# Patient Record
Sex: Female | Born: 1960 | Race: White | Hispanic: No
Health system: Midwestern US, Academic
[De-identification: ages and names within clinical notes are randomized; demographics above are authoritative.]

---

## 2017-01-25 MED ORDER — LIDOCAINE (PF) 200 MG/10 ML (2 %) IJ SYRG
0 refills | Status: DC
Start: 2017-01-25 — End: 2017-01-25

## 2017-01-25 MED ORDER — REMIFENTANYL 1000MCG IN NS 20ML (OR)
0 refills | Status: DC
Start: 2017-01-25 — End: 2017-01-25

## 2017-01-25 MED ORDER — KETAMINE 10 MG/ML IJ SOLN
0 refills | Status: DC
Start: 2017-01-25 — End: 2017-01-25

## 2017-01-25 MED ORDER — FENTANYL CITRATE (PF) 50 MCG/ML IJ SOLN
0 refills | Status: DC
Start: 2017-01-25 — End: 2017-01-25

## 2017-01-25 MED ORDER — ACETAMINOPHEN 1,000 MG/100 ML (10 MG/ML) IV SOLN
0 refills | Status: DC
Start: 2017-01-25 — End: 2017-01-25

## 2017-01-25 MED ORDER — DEXAMETHASONE SODIUM PHOSPHATE 4 MG/ML IJ SOLN
INTRAVENOUS | 0 refills | Status: DC
Start: 2017-01-25 — End: 2017-01-25

## 2017-01-25 MED ORDER — HALOPERIDOL LACTATE 5 MG/ML IJ SOLN
0 refills | Status: DC
Start: 2017-01-25 — End: 2017-01-25

## 2017-01-25 MED ORDER — PHENYLEPHRINE IN 0.9% NACL(PF) 1 MG/10 ML (100 MCG/ML) IV SYRG
INTRAVENOUS | 0 refills | Status: DC
Start: 2017-01-25 — End: 2017-01-25

## 2017-01-25 MED ORDER — PROPOFOL INJ 10 MG/ML IV VIAL
0 refills | Status: DC
Start: 2017-01-25 — End: 2017-01-25

## 2017-01-25 MED ORDER — SUCCINYLCHOLINE CHLORIDE 20 MG/ML IJ SOLN
INTRAVENOUS | 0 refills | Status: DC
Start: 2017-01-25 — End: 2017-01-25

## 2017-01-25 MED ORDER — ONDANSETRON HCL (PF) 4 MG/2 ML IJ SOLN
INTRAVENOUS | 0 refills | Status: DC
Start: 2017-01-25 — End: 2017-01-25

## 2017-01-25 MED ORDER — CEFAZOLIN 1 GRAM IJ SOLR
0 refills | Status: DC
Start: 2017-01-25 — End: 2017-01-25

## 2017-01-25 MED ORDER — PHENYLEPHRINE IV DRIP (STD CONC)
0 refills | Status: DC
Start: 2017-01-25 — End: 2017-01-25

## 2017-03-20 MED ORDER — METHYLPREDNISOLONE 4 MG PO DSPK
ORAL_TABLET | 0 refills | Status: AC
Start: 2017-03-20 — End: ?

## 2017-05-09 ENCOUNTER — Ambulatory Visit: Admit: 2017-05-09 | Discharge: 2017-05-10 | Payer: MEDICARE

## 2017-05-09 DIAGNOSIS — H903 Sensorineural hearing loss, bilateral: Principal | ICD-10-CM

## 2017-05-09 NOTE — Progress Notes
Loretta Maxwell was seen in the clinic today for continued monitoring of her Advanced Bionics cochlear implant.    Patient History:  Loretta Maxwell is sent by Loretta Maxwell for cochlear implant evaluation.  She has a history of Meniere's disease, what sounds like bilaterally and has undergone bilateral endolymphatic sac surgery.  Her left ear is her worse ear.     Surgical Note:  Full round window insertion of the electrode was performed.  This was also noted on the x-ray.     [name] returns for routine cochlear implant follow up. She reports she continues to improve with clarity but acknowledges she still misses parts of conversations.    3 month outcomes assessed first, minor mapping changes made after.       Internal Device  Ear Internal Device Initial Stimulation Date Time Post Initial Stimulation Surgeon   left Hi Res Ultra/ Slim J 02/06/17 3 months Dr. Paulene Floor       Equipment:   Ear Processor Serial Number Magnet Strength Processor Condition   right [id number] new   right [id number] New     Implant site: no concerns    Manufacturer: Phonak  Model: Naida Link UP  Serial Numbers: [id number]     Repair Warranty: 02/06/18  L&D Warranty: 02/06/18  Battery: 675  [name]: [id number]  Programs: 1- AutoSense  Volume: activated  Fit Date: 02/06/17  Purchase Type: included in CI kit    This hearing aid is not paired to CI processor.      Assessment of Auditory Rehabilitation Status  Start Time: 945a    End Time: 1020am    SF thresholds assessed, freshnoise, contra ear open:  250 Hz: 30 dB  500 Hz: 30 dB  1000 Hz: 35 dB  2000 Hz: 40 dB  3000 Hz: 40 dB  4000 Hz: 30 dB  6000 Hz: 25 dB      Test pre operative - binaural hearing aids 3 months -implant only Date Date   CNC Words 64% 40%     AZ Bio (in Quiet) 80% 32%  (81% + contra aid)     Right aid only 63%      Left aid only 29%      AZ Bio (+5 dB SNR) 29% 6%       Residual Hearing Check:  Left PTA 100 dB  (pre op: 85 dB) Right PTA 85 dB (pre op: 75 dB)  *recall this ear dropped, noted at our last visit.  Dr. Juel Burrow prescribed steroid burst.   Today's thresholds are stable with last visit.      Programming: (sans contralateral aid)  ??? Impedences WNL  ??? e16 deactivated: no loudness growth as charge unit increased / short circuit - new as of 03/20/17    Familiar Map 8 opened  ??? [name] reports she would like a little more volume  ??? e2-5 and 12-14 increased slightly to perceptually increase volume  ??? Clearvoice Medium activated today  ??? WindBlock activated today.    Programmed today: 1. Hi Res Optima S 9          2. 10 (Ultra Zoom)    Summary:   [name] was seen for continued monitoring of her cochlear implant.  Soundfield check confirms audibility and outcomes confirm functionality.   At 3 months post implantation, she is scoring the same bimodally as she did with binaural aids pre-operatively however her subjective improvement is large.  Clinical Recommendations:   ??? Next Visit: 6 month outcomes    Patient Recommendations:   ??? [name] should return for reprogramming 08/05/2017

## 2017-08-05 ENCOUNTER — Ambulatory Visit: Admit: 2017-08-05 | Discharge: 2017-08-05 | Payer: MEDICARE

## 2017-08-05 DIAGNOSIS — H903 Sensorineural hearing loss, bilateral: Principal | ICD-10-CM

## 2017-08-05 NOTE — Progress Notes
Loretta Maxwell was seen in the clinic today for continued monitoring of her Advanced Bionics cochlear implant.    Patient History:  [name] is sent by [name] for cochlear implant evaluation.  She has a history of Meniere's disease, what sounds like bilaterally and has undergone bilateral endolymphatic sac surgery.  Her left ear is her worse ear.     Surgical Note:  Full round window insertion of the electrode was performed.  This was also noted on the x-ray.     [name] returns for routine cochlear implant follow up. Upon arrival, she appears dismal about the lack of progress made with her implant.    6 month outcomes assessed first, minor mapping changes made after.       Internal Device  Ear Internal Device Initial Stimulation Date Time Post Initial Stimulation Surgeon   left Hi Res Ultra/ Slim J 02/06/17 6 months Dr. Paulene Floor / [facility]       Equipment:   Ear Processor Serial Number Magnet Strength Processor Condition   right [id number] new   right [id number] New     Implant site: no concerns    Manufacturer: Phonak  Model: Naida Link UP  Serial Numbers: [id number]     Repair Warranty: 02/06/18  L&D Warranty: 02/06/18  Battery: 675  [name]: [id number]  Programs: 1- AutoSense  Volume: activated  Fit Date: 02/06/17  Purchase Type: included in CI kit    This hearing aid is not paired to CI processor.      Assessment of Auditory Rehabilitation Status  Start Time: 1p    End Time: 140p    SF thresholds assessed today and results found to be within the expected range (see Procedures tab for scan).       Test pre operative - binaural hearing aids 3 months -implant only 6 months - implant only, contra ear open Date   CNC Words 64% 40% 44%    AZ Bio (in Quiet) 80% 32%  (81% + contra aid) 48%    Right aid only 63%      Left aid only 29%      AZ Bio (+5 dB SNR) 29% 6% 18%      Residual Hearing Check assessed today (see Procedures tab for scan).  Left PTA 107 dB  (pre op: 85 dB) Right PTA 83 dB (pre op: 75 dB)  *recall this ear dropped, noted 02/2017.  Dr. Juel Burrow prescribed steroid burst - ear did not recover.      Programming: (sans contralateral aid)  ??? Impedences WNL  ??? e16 deactivated: no loudness growth as charge unit increased / short circuit - new as of 03/20/17    Familiar Map 9 opened  ??? Global MCLs increased to live voice  ??? Her Tmic was also change today as it has been 6 months and she is a smoker.    Programmed today: 1. Hi Res Optima S 11          2. 12 (Ultra Zoom)    Summary:   [name] was seen for continued monitoring of her cochlear implant.  Soundfield check confirms audibility and outcomes confirm functionality with improvement noted since last check three months ago.  Subjectively, the benefit since implantation is high.      Clinical Recommendations:   ??? Next Visit: 12 month outcomes  ??? Next Visit: change Tmic (in her backup kit)    Patient Recommendations:   ??? [name] should return  for reprogramming 01/20/2018.

## 2018-01-08 ENCOUNTER — Encounter: Admit: 2018-01-08 | Discharge: 2018-01-08 | Payer: MEDICAID

## 2018-01-23 ENCOUNTER — Encounter: Admit: 2018-01-23 | Discharge: 2018-01-23 | Payer: MEDICAID

## 2018-01-23 DIAGNOSIS — M1990 Unspecified osteoarthritis, unspecified site: ICD-10-CM

## 2018-01-23 DIAGNOSIS — E119 Type 2 diabetes mellitus without complications: Principal | ICD-10-CM

## 2018-01-23 DIAGNOSIS — G510 Bell's palsy: ICD-10-CM

## 2018-01-23 DIAGNOSIS — G4733 Obstructive sleep apnea (adult) (pediatric): ICD-10-CM

## 2018-01-23 DIAGNOSIS — K589 Irritable bowel syndrome without diarrhea: ICD-10-CM

## 2018-01-23 DIAGNOSIS — I82409 Acute embolism and thrombosis of unspecified deep veins of unspecified lower extremity: ICD-10-CM

## 2018-01-23 DIAGNOSIS — K219 Gastro-esophageal reflux disease without esophagitis: ICD-10-CM

## 2018-01-23 DIAGNOSIS — H8109 Meniere's disease, unspecified ear: ICD-10-CM

## 2018-01-23 DIAGNOSIS — R000 Tachycardia, unspecified: ICD-10-CM

## 2018-01-23 DIAGNOSIS — K760 Fatty (change of) liver, not elsewhere classified: ICD-10-CM

## 2018-01-24 ENCOUNTER — Ambulatory Visit: Admit: 2018-01-24 | Discharge: 2018-01-24 | Payer: Medicare Other

## 2018-01-24 DIAGNOSIS — H903 Sensorineural hearing loss, bilateral: Principal | ICD-10-CM

## 2019-09-30 ENCOUNTER — Encounter: Admit: 2019-09-30 | Discharge: 2019-09-30 | Payer: MEDICAID

## 2019-09-30 ENCOUNTER — Ambulatory Visit: Admit: 2019-09-30 | Discharge: 2019-09-30 | Payer: MEDICAID

## 2019-09-30 DIAGNOSIS — H903 Sensorineural hearing loss, bilateral: Secondary | ICD-10-CM

## 2019-09-30 NOTE — Progress Notes
Loretta Maxwell was seen in the clinic today for continued monitoring of her Advanced Bionics cochlear implant.    Patient History:  Loretta Maxwell is sent by Loretta Maxwell for cochlear implant evaluation.  She has a history of Meniere's disease, what sounds like bilaterally and has undergone bilateral endolymphatic sac surgery.  Her left ear is her worse ear.     Surgical Note:  Surgery Date: 01/25/2017  Full round window insertion of the electrode was performed.  This was also noted on the x-ray.     Loretta Maxwell returns for routine cochlear implant follow up.  She is having no concerns, outside of a random intermittency in sound quality (could be parts?)        Internal Device  Ear Internal Device Initial Stimulation Date Time Post Initial Stimulation Surgeon   left Hi Res Ultra/ Slim J 02/06/17 2 years, 8 months Dr. Paulene Floor / [facility]       Equipment:   Ear Processor Serial Number Magnet Strength Processor Condition   right [id number] good   right [id number] [id number] Used; has broken cable, very used headpiece and a broken Tmic     Implant site: no concerns  Incision: no concerns    Manufacturer: Phonak  Model: Naida Link UP  Serial Numbers: [id number]     Repair Warranty: 02/06/18  L&D Warranty: 02/06/18  Battery: 675  [name]: [id number]  Programs: 1- AutoSense  Volume: activated  Fit Date: 02/06/17  Purchase Type: included in CI kit    This hearing aid is not paired to CI processor.      Assessment of Auditory Rehabilitation Status  not assessed today    SF thresholds assessed today and results found to be within the expected range ~25 / 30 dB 250 - 6000 Hz       Test pre operative - binaural hearing aids 3 months -implant only 6 months - implant only, contra ear open 12 months - implant only, contra ear open    CNC Words 64% 40% 44% 64%    AZ Bio (in Quiet) 80%  81%      Right aid only 63%       Left aid only 29% 32% 48% 74%    AZ Bio (+5 dB SNR) 29% 6% 18% 16%      Residual Hearing Check assessed today Left PTA 83 dB  (pre op: 85 dB)  Right PTA 87 dB (pre op: 75 dB)  *recall this ear dropped, noted 02/2017.  Dr. Juel Burrow prescribed steroid burst - ear did not recover.      Programming:  ? Impedences were assessed and found to be stable  ? e16 deactivated: no loudness growth as charge unit increased / short circuit - new as of 03/20/17  ? e15 deactivated today: impedance higher than neighboring electrodes    Familiar Map 11 opened and settings confirmed  e15 deactivated today  Soft Voice actived.    Setting saved as 14.    Programmed today: Hi Res Optima S 14    Summary:   [name] was seen for continued monitoring of her cochlear implant.   Stable subjective report.    Clinical Recommendations:   ? Next Visit: outcomes    Patient Recommendations:   ? [name] should return for reprogramming 12-18 months  ? Consider Right implant (Medicare is primary insurance)  ? Contact AB to replace broken parts.

## 2022-01-17 ENCOUNTER — Encounter: Admit: 2022-01-17 | Discharge: 2022-01-17 | Payer: MEDICAID

## 2022-02-05 ENCOUNTER — Encounter: Admit: 2022-02-05 | Discharge: 2022-02-05 | Payer: Medicare Other

## 2022-02-05 ENCOUNTER — Ambulatory Visit: Admit: 2022-02-05 | Discharge: 2022-02-05 | Payer: Medicare Other

## 2022-02-28 ENCOUNTER — Ambulatory Visit: Admit: 2022-02-28 | Discharge: 2022-02-28 | Payer: Medicare Other

## 2022-02-28 ENCOUNTER — Encounter: Admit: 2022-02-28 | Discharge: 2022-02-28 | Payer: Medicare Other

## 2022-02-28 DIAGNOSIS — Z9621 Cochlear implant status: Secondary | ICD-10-CM

## 2022-02-28 DIAGNOSIS — H903 Sensorineural hearing loss, bilateral: Secondary | ICD-10-CM

## 2022-05-24 ENCOUNTER — Encounter: Admit: 2022-05-24 | Discharge: 2022-05-24 | Payer: Medicare Other

## 2022-06-07 ENCOUNTER — Encounter: Admit: 2022-06-07 | Discharge: 2022-06-07 | Payer: Medicare Other

## 2022-06-12 ENCOUNTER — Encounter: Admit: 2022-06-12 | Discharge: 2022-06-12 | Payer: Medicare Other

## 2022-06-13 ENCOUNTER — Encounter: Admit: 2022-06-13 | Discharge: 2022-06-13 | Payer: Private Health Insurance - Indemnity

## 2022-06-14 ENCOUNTER — Ambulatory Visit: Admit: 2022-06-14 | Discharge: 2022-06-15 | Payer: Private Health Insurance - Indemnity

## 2022-06-14 ENCOUNTER — Encounter: Admit: 2022-06-14 | Discharge: 2022-06-14 | Payer: Private Health Insurance - Indemnity

## 2022-06-14 DIAGNOSIS — G4733 Obstructive sleep apnea (adult) (pediatric): Secondary | ICD-10-CM

## 2022-06-14 DIAGNOSIS — G510 Bell's palsy: Secondary | ICD-10-CM

## 2022-06-14 DIAGNOSIS — K219 Gastro-esophageal reflux disease without esophagitis: Secondary | ICD-10-CM

## 2022-06-14 DIAGNOSIS — E119 Type 2 diabetes mellitus without complications: Secondary | ICD-10-CM

## 2022-06-14 DIAGNOSIS — M1990 Unspecified osteoarthritis, unspecified site: Secondary | ICD-10-CM

## 2022-06-14 DIAGNOSIS — Z01818 Encounter for other preprocedural examination: Secondary | ICD-10-CM

## 2022-06-14 DIAGNOSIS — K760 Fatty (change of) liver, not elsewhere classified: Secondary | ICD-10-CM

## 2022-06-14 DIAGNOSIS — I82409 Acute embolism and thrombosis of unspecified deep veins of unspecified lower extremity: Secondary | ICD-10-CM

## 2022-06-14 DIAGNOSIS — K589 Irritable bowel syndrome without diarrhea: Secondary | ICD-10-CM

## 2022-06-14 DIAGNOSIS — R000 Tachycardia, unspecified: Secondary | ICD-10-CM

## 2022-06-14 DIAGNOSIS — H8109 Meniere's disease, unspecified ear: Secondary | ICD-10-CM

## 2022-06-14 NOTE — Pre-Anesthesia Patient Instructions
PREPROCEDURE INFORMATION    Arrival at the hospital  [facility]  [address]  [place], [place] [address]    Park in the P5 parking garage located at [address], [place], [place] [address].   If parking in the P5 garage, take the east elevators in the parking garage to the second level and walk to the entrance of the [employer].    Enter through the 1st floor main entrance and check in with Information Desk.   If you are a woman between the ages of 67 and 56, and have not had a hysterectomy, you will be asked for a urine sample prior to surgery.  Please do not urinate before arriving in the Surgery Waiting Room.  Once there, check in and let the attendant know if you need to provide a sample.    You will receive a call with your surgery arrival time between 2:30pm and 4:30pm the last business day before your procedure.  If you do not receive a call, please call [phone] before 4:30pm or [phone] after 4:30pm.    Eating or drinking before surgery  Nothing to eat after 11:00pm the night before your surgery including gum, mint, candy. You may have clear liquids up to 2 hours before your surgery time. If you have received specific instructions from your surgeon, please follow those.     Clear Liquid Examples:   Clear juice (Apple or cranberry), no pulp   Coffee and tea with or without sugar (no cream)   Sports drinks - Powerade/Gatorade   Soda   Bowel Prep solutions        Planning transportation for outpatient procedure  For your safety, you will need to arrange for a responsible ride/person to accompany you home due to sedation or anesthesia with your procedure.  An [employer], taxi or other public transportation driver is not considered a responsible person to accompany you home.    Bath/Shower Instructions  Take a bath or shower with antibacterial soap the night before and the morning of your procedure. Use clean towels.  Put on clean clothes after bath or shower.  Avoid using lotion and oils.  Sleep on clean sheets if bath or shower is done the night before procedure.    Morning of your procedure:  Brush your teeth and tongue  Do not smoke, vape, chew or user any tobacco products.  Do not shave the area where you will have surgery.  Remove nail polish, makeup and all jewelry (including piercings) before coming to the hospital.  Dress in clean, loose, comfortable clothing.    Valuables  Leave money, credit cards, jewelry, and any other valuables at home. The [facility] is not responsible for the loss or breakage of personal items.    What to bring to the hospital  ID/Insurance card  Medical Device card  Official documents for legal guardianship  Copy of your Living Will, Advanced Directives, and/or Durable Power of Attorney. If you have these documents, please bring them to the admissions office on the day of your surgery to be scanned into your records.  Do not bring medications from home unless instructed by a pharmacist.  CPAP/BiPAP machine (including all supplies)  Walker, cane, or motorized scooter  Cases for glasses/hearing aids/contact lens (bring solutions for contacts)  Stimulator remote  Insulin pump and supplies as directed by pharmacy     Notify us at [employer]: [phone] on the day of your procedure if:  You need to cancel your procedure.  You are going to be late.    Notify your surgeon if:  There is a possibility that you are pregnant.  You become ill with a cough, fever, sore throat, nausea, vomiting or flu-like symptoms.  You have any open wounds/sores that are red, painful, draining, or are new since you last saw the doctor.  You need to cancel your procedure.    Preparing to get your medications at discharge  Your surgeon may prescribe you medications to take after your procedure.  If you like the convenience of having your medications filled here at [facility], please do the following:  Go to [place] pharmacy after your [facility] appointment to put a credit card on file.  Call [place] pharmacy at [phone] (Monday-Friday 7am-9pm or Saturday and Sunday 9am-5pm) to put a credit card on file.  Bring a credit card or cash on the day of your procedure- please leave with a family member rather than bringing it into the preop area.    Current Visitor Policy:  Visitors must be free of fever and symptoms to be in our facilities.  No more than 2 visitors per patient are allowed.  Additional guidelines may vary, based on patient care area or patient's condition.  Patients in semiprivate rooms may have visitors, but visits should be coordinated so only two total visitors are in a room at a time due to space limitations.  Children younger than age 62 are allowed to visit inpatients.    Thank you for participating in your Preoperative Assessment Clinic visit today.    If you have any changes to your health or hospitalizations between now and your surgery, please call us at [phone] for Main instructions.    Instructions given to patient via: MyChart and verbal

## 2022-06-19 ENCOUNTER — Ambulatory Visit: Admit: 2022-06-19 | Discharge: 2022-06-20 | Payer: Private Health Insurance - Indemnity

## 2022-06-29 ENCOUNTER — Ambulatory Visit: Admit: 2022-06-29 | Discharge: 2022-06-29 | Payer: Private Health Insurance - Indemnity

## 2022-06-29 ENCOUNTER — Encounter: Admit: 2022-06-29 | Discharge: 2022-06-29 | Payer: Private Health Insurance - Indemnity

## 2022-06-29 DIAGNOSIS — R000 Tachycardia, unspecified: Secondary | ICD-10-CM

## 2022-06-29 DIAGNOSIS — H8109 Meniere's disease, unspecified ear: Secondary | ICD-10-CM

## 2022-06-29 DIAGNOSIS — G4733 Obstructive sleep apnea (adult) (pediatric): Secondary | ICD-10-CM

## 2022-06-29 DIAGNOSIS — K589 Irritable bowel syndrome without diarrhea: Secondary | ICD-10-CM

## 2022-06-29 DIAGNOSIS — M1990 Unspecified osteoarthritis, unspecified site: Secondary | ICD-10-CM

## 2022-06-29 DIAGNOSIS — E119 Type 2 diabetes mellitus without complications: Secondary | ICD-10-CM

## 2022-06-29 DIAGNOSIS — I82409 Acute embolism and thrombosis of unspecified deep veins of unspecified lower extremity: Secondary | ICD-10-CM

## 2022-06-29 DIAGNOSIS — G510 Bell's palsy: Secondary | ICD-10-CM

## 2022-06-29 DIAGNOSIS — K760 Fatty (change of) liver, not elsewhere classified: Secondary | ICD-10-CM

## 2022-06-29 DIAGNOSIS — K219 Gastro-esophageal reflux disease without esophagitis: Secondary | ICD-10-CM

## 2022-06-29 MED ORDER — CEFAZOLIN 1 GRAM IJ SOLR
INTRAVENOUS | 0 refills | Status: DC
Start: 2022-06-29 — End: 2022-06-29
  Administered 2022-06-29: 16:00:00 2 g via INTRAVENOUS

## 2022-06-29 MED ORDER — FENTANYL CITRATE (PF) 50 MCG/ML IJ SOLN
INTRAVENOUS | 0 refills | Status: DC
Start: 2022-06-29 — End: 2022-06-29
  Administered 2022-06-29: 16:00:00 75 ug via INTRAVENOUS

## 2022-06-29 MED ORDER — PROPOFOL 10 MG/ML IV EMUL 100 ML (INFUSION)(AM)(OR)
INTRAVENOUS | 0 refills | Status: DC
Start: 2022-06-29 — End: 2022-06-29
  Administered 2022-06-29: 16:00:00 120 ug/kg/min via INTRAVENOUS

## 2022-06-29 MED ORDER — DEXAMETHASONE SODIUM PHOSPHATE 4 MG/ML IJ SOLN
INTRAVENOUS | 0 refills | Status: DC
Start: 2022-06-29 — End: 2022-06-29
  Administered 2022-06-29: 16:00:00 4 mg via INTRAVENOUS

## 2022-06-29 MED ORDER — PHENYLEPHRINE HCL IN 0.9% NACL 1 MG/10 ML (100 MCG/ML) IV SYRG
INTRAVENOUS | 0 refills | Status: DC
Start: 2022-06-29 — End: 2022-06-29
  Administered 2022-06-29 (×6): 100 ug via INTRAVENOUS

## 2022-06-29 MED ORDER — EPHEDRINE SULFATE 50 MG/ML IV SOLN
INTRAVENOUS | 0 refills | Status: DC
Start: 2022-06-29 — End: 2022-06-29
  Administered 2022-06-29: 17:00:00 10 mg via INTRAVENOUS

## 2022-06-29 MED ORDER — ARTIFICIAL TEARS (PF) SINGLE DOSE DROPS GROUP
OPHTHALMIC | 0 refills | Status: DC
Start: 2022-06-29 — End: 2022-06-29
  Administered 2022-06-29: 16:00:00 2 [drp] via OPHTHALMIC

## 2022-06-29 MED ORDER — SUCCINYLCHOLINE CHLORIDE 20 MG/ML IJ SOLN
INTRAVENOUS | 0 refills | Status: DC
Start: 2022-06-29 — End: 2022-06-29
  Administered 2022-06-29: 16:00:00 100 mg via INTRAVENOUS

## 2022-06-29 MED ORDER — PROPOFOL INJ 10 MG/ML IV VIAL
INTRAVENOUS | 0 refills | Status: DC
Start: 2022-06-29 — End: 2022-06-29
  Administered 2022-06-29: 16:00:00 150 mg via INTRAVENOUS

## 2022-06-29 MED ORDER — LIDOCAINE (PF) 200 MG/10 ML (2 %) IJ SYRG
INTRAVENOUS | 0 refills | Status: DC
Start: 2022-06-29 — End: 2022-06-29
  Administered 2022-06-29: 16:00:00 80 mg via INTRAVENOUS

## 2022-06-29 MED ORDER — REMIFENTANYL 1000MCG IN NS 20ML (OR)
INTRAVENOUS | 0 refills | Status: DC
Start: 2022-06-29 — End: 2022-06-29
  Administered 2022-06-29 (×2): .08 ug/kg/min via INTRAVENOUS

## 2022-06-29 MED ORDER — ONDANSETRON HCL (PF) 4 MG/2 ML IJ SOLN
INTRAVENOUS | 0 refills | Status: DC
Start: 2022-06-29 — End: 2022-06-29
  Administered 2022-06-29: 17:00:00 4 mg via INTRAVENOUS

## 2022-06-29 MED ADMIN — OXYCODONE 5 MG PO TAB [10814]: 5 mg | ORAL | @ 18:00:00 | Stop: 2022-06-29 | NDC 00406055223

## 2022-06-29 MED ADMIN — BACLOFEN 20 MG PO TAB [861]: 20 mg | ORAL | @ 19:00:00 | Stop: 2022-06-29 | NDC 00172409780

## 2022-06-29 MED ADMIN — EPINEPHRINE 1 MG/ML IJ SOLN [2850]: 2 mg | @ 17:00:00 | Stop: 2022-06-29 | NDC 42023016801

## 2022-06-29 MED ADMIN — CEFAZOLIN 1 GRAM IJ SOLR [1445]: 3000 mL | @ 17:00:00 | Stop: 2022-06-29 | NDC 00143992490

## 2022-06-29 MED ADMIN — LACTATED RINGERS IV SOLP [4318]: 1000 mL | INTRAVENOUS | @ 14:00:00 | Stop: 2022-06-29 | NDC 00338011704

## 2022-06-29 MED ADMIN — SODIUM CHLORIDE 0.9% IRRIGATION BAG [210330]: 3000 mL | @ 17:00:00 | Stop: 2022-06-29 | NDC 00338004747

## 2022-06-29 MED ADMIN — FENTANYL CITRATE (PF) 50 MCG/ML IJ SOLN [3037]: 50 ug | INTRAVENOUS | @ 18:00:00 | Stop: 2022-06-29 | NDC 00409909412

## 2022-06-29 MED ADMIN — SODIUM CHLORIDE 0.9 % IR SOLN [11403]: 1000 mL | @ 17:00:00 | Stop: 2022-06-29 | NDC 00338004804

## 2022-06-29 MED ADMIN — LIDOCAINE-EPINEPHRINE 1 %-1:100,000 IJ SOLN [15955]: 10 mL | INTRAMUSCULAR | @ 17:00:00 | Stop: 2022-06-29 | NDC 00409317817

## 2022-06-29 MED ADMIN — CEFAZOLIN 1 GRAM IJ SOLR [1445]: 1000 mL | @ 17:00:00 | Stop: 2022-06-29 | NDC 00143992490

## 2022-06-29 MED ADMIN — CELECOXIB 200 MG PO CAP [76958]: 200 mg | ORAL | @ 19:00:00 | Stop: 2022-06-29 | NDC 00904650361

## 2022-06-29 MED ADMIN — HALOPERIDOL LACTATE 5 MG/ML IJ SOLN [3584]: 1 mg | INTRAVENOUS | @ 18:00:00 | Stop: 2022-06-29 | NDC 63323047400

## 2022-06-29 MED FILL — CEPHALEXIN 500 MG PO CAP: 500 mg | ORAL | 5 days supply | Qty: 15 | Fill #1 | Status: CP

## 2022-06-29 MED FILL — METHYLPREDNISOLONE 4 MG PO DSPK: 4 mg | 6 days supply | Qty: 21 | Fill #1 | Status: CP

## 2022-06-29 MED FILL — TRAMADOL 50 MG PO TAB: 50 mg | ORAL | 3 days supply | Qty: 8 | Fill #1 | Status: CP

## 2022-07-02 ENCOUNTER — Encounter: Admit: 2022-07-02 | Discharge: 2022-07-02 | Payer: Private Health Insurance - Indemnity

## 2022-07-02 DIAGNOSIS — K219 Gastro-esophageal reflux disease without esophagitis: Secondary | ICD-10-CM

## 2022-07-02 DIAGNOSIS — G510 Bell's palsy: Secondary | ICD-10-CM

## 2022-07-02 DIAGNOSIS — M1990 Unspecified osteoarthritis, unspecified site: Secondary | ICD-10-CM

## 2022-07-02 DIAGNOSIS — H8109 Meniere's disease, unspecified ear: Secondary | ICD-10-CM

## 2022-07-02 DIAGNOSIS — G4733 Obstructive sleep apnea (adult) (pediatric): Secondary | ICD-10-CM

## 2022-07-02 DIAGNOSIS — K760 Fatty (change of) liver, not elsewhere classified: Secondary | ICD-10-CM

## 2022-07-02 DIAGNOSIS — I82409 Acute embolism and thrombosis of unspecified deep veins of unspecified lower extremity: Secondary | ICD-10-CM

## 2022-07-02 DIAGNOSIS — K589 Irritable bowel syndrome without diarrhea: Secondary | ICD-10-CM

## 2022-07-02 DIAGNOSIS — E119 Type 2 diabetes mellitus without complications: Secondary | ICD-10-CM

## 2022-07-02 DIAGNOSIS — R000 Tachycardia, unspecified: Secondary | ICD-10-CM

## 2022-07-09 ENCOUNTER — Ambulatory Visit: Admit: 2022-07-09 | Discharge: 2022-07-09 | Payer: Private Health Insurance - Indemnity

## 2022-07-09 ENCOUNTER — Encounter: Admit: 2022-07-09 | Discharge: 2022-07-09 | Payer: Private Health Insurance - Indemnity

## 2022-07-09 DIAGNOSIS — H903 Sensorineural hearing loss, bilateral: Secondary | ICD-10-CM

## 2022-07-09 DIAGNOSIS — K589 Irritable bowel syndrome without diarrhea: Secondary | ICD-10-CM

## 2022-07-09 DIAGNOSIS — G510 Bell's palsy: Secondary | ICD-10-CM

## 2022-07-09 DIAGNOSIS — K760 Fatty (change of) liver, not elsewhere classified: Secondary | ICD-10-CM

## 2022-07-09 DIAGNOSIS — H8109 Meniere's disease, unspecified ear: Secondary | ICD-10-CM

## 2022-07-09 DIAGNOSIS — G4733 Obstructive sleep apnea (adult) (pediatric): Secondary | ICD-10-CM

## 2022-07-09 DIAGNOSIS — R000 Tachycardia, unspecified: Secondary | ICD-10-CM

## 2022-07-09 DIAGNOSIS — K219 Gastro-esophageal reflux disease without esophagitis: Secondary | ICD-10-CM

## 2022-07-09 DIAGNOSIS — I82409 Acute embolism and thrombosis of unspecified deep veins of unspecified lower extremity: Secondary | ICD-10-CM

## 2022-07-09 DIAGNOSIS — M1990 Unspecified osteoarthritis, unspecified site: Secondary | ICD-10-CM

## 2022-07-09 DIAGNOSIS — E119 Type 2 diabetes mellitus without complications: Secondary | ICD-10-CM

## 2022-07-09 MED ORDER — CLONAZEPAM 0.25 MG PO TBDI
.25 mg | ORAL_TABLET | Freq: Three times a day (TID) | ORAL | 0 refills | Status: AC | PRN
Start: 2022-07-09 — End: ?

## 2022-07-09 NOTE — Progress Notes
Loretta Maxwell was seen in the clinic today for initial activation of her Advanced Bionics cochlear implant.    Patient History:  Loretta Maxwell is sent by [name] for cochlear implant evaluation.  She has a history of Meniere's disease, what sounds like bilaterally and has undergone bilateral endolymphatic sac surgery.  Her left ear is her worse ear.     Surgical Note:  1st implant surgery Date: 01/25/2017  Explanted 06/29/22 (V1 failure)  2nd implant surgery date: 06/29/2022  I inserted the device off its stylette due to its round window I then got an x-ray and it appeared the compartments location in the cochlea.        Internal Device  Ear Internal Device Initial Stimulation Date Time Post Initial Stimulation Surgeon / location   left Hi Res Ultra 3D Hi Focus midScala V2 06/29/2022 -- Dr. Paulene Floor, MD / [place]   left Hi Res Ultra / HiFocus MidScala Explanted 06/29/2022 5 years, 1 month Dr. Paulene Floor / [place]       Equipment:   Ear Processor Magnet Strength Processor Condition   left [id number] used   left [phone]  Used - needs cable and battery   right Naida P90-UP  Fit from outside clinic     Implant site: no concerns  Incision: no concerns      Assessment of Aural Rehabilitation Status: not assessed today    Pre op:  Test pre operative - binaural hearing aids   CNC Words - bilateral 64%   CNC Words - left    AZ Bio (in Quiet) - bilateral 80%   ? Right aid only 63%   ? Left aid only 29%   AZ Bio (+5 dB SNR) - bilateral 29%   AZ Bio (+5 dB SNR) - left          Programming:  ? Impedences were assessed today; all were found to be WNL.    M levels set via speech bursts using the loudness growth chart.  High frequency loudness growth verified via tone bursts.  In live mode, global MCLs increased to her satisfaction.  E1-3 rolled off to improve quality.    Setting saved as 1.    Programmed today:  Hi Res Optima S 1    Summary:   Loretta Maxwell was seen for initial activation of her revised cochlear implant.  Upon activation, she reported a good strong sound quality.     Clinic recommendations:  Next visit: SF thresholds      Recommendations:   ? Loretta Maxwell will return as scheduled for routine mapping, in 1.5 weeks.  ? Loretta Maxwell has new insurance therefore will attempt to obtain 1 new cable and 1 new battery so her backup processor is functional.  ? Of note, with her previous insurance she was approved for a processor upgrade however she was unable to pay the co-pay.

## 2022-07-25 ENCOUNTER — Encounter: Admit: 2022-07-25 | Discharge: 2022-07-25 | Payer: Private Health Insurance - Indemnity

## 2022-08-02 ENCOUNTER — Encounter: Admit: 2022-08-02 | Discharge: 2022-08-02 | Payer: Medicare Other

## 2022-08-02 ENCOUNTER — Ambulatory Visit: Admit: 2022-08-02 | Discharge: 2022-08-02 | Payer: Medicare Other

## 2022-08-02 DIAGNOSIS — H903 Sensorineural hearing loss, bilateral: Secondary | ICD-10-CM

## 2022-08-02 NOTE — Progress Notes
Loretta Maxwell was seen in the clinic today for continued monitoring of her Advanced Bionics cochlear implant.    Patient History:  Loretta Maxwell is sent by Loretta Maxwell for cochlear implant evaluation.  She has a history of Meniere's disease, what sounds like bilaterally and has undergone bilateral endolymphatic sac surgery.  Her left ear is her worse ear.     Surgical Note:  1st implant surgery Date: 01/25/2017  Explanted 06/29/22 (V1 failure)  2nd implant surgery date: 06/29/2022  I inserted the device off its stylette due to its round window I then got an x-ray and it appeared the compartments location in the cochlea.        Shell returns today for routine cochlear implant follow up.  She reports she feels she has been hearing well since our last session, 1 month ago.  She does mention tightness at her magnet site.      Internal Device  Ear Internal Device Initial Stimulation Date Time Post Initial Stimulation Surgeon / location   left Hi Res Ultra 3D Hi Focus midScala V2 06/29/2022 1 month Dr. Paulene Floor, MD / [place]   left Hi Res Ultra / HiFocus MidScala Explanted 06/29/2022 5 years, 1 month Dr. Paulene Floor / [place]       Equipment:   Ear Processor Magnet Strength Processor Condition   left [phone] used   left [phone]  Used - needs cable and battery   right Naida P90-UP  Fit from outside clinic     Implant site: no concerns, but magnet was reduced to a 1 today.  Incision: no concerns      Assessment of Aural Rehabilitation Status: Start Time: 11am   End time: 1140a    SF thresholds assessed today and were found to be within the expected range:        Pre op:  Test pre operative - binaural hearing aids   CNC Words - bilateral 64%   CNC Words - left    AZ Bio (in Quiet) - bilateral 80%   ? Right aid only 63%   ? Left aid only 29%   AZ Bio (+5 dB SNR) - bilateral 29%   AZ Bio (+5 dB SNR) - left      Left:   test 1 month   CNC Words 46%   Az Bio Q  42%       Programming:  ? Impedences were assessed today; all were found to be WNL.    No mapping parameters changed today.    Current program:  Hi Res Optima S 1    Summary:   Loretta Maxwell was seen for continued monitoring of her revised cochlear implant.  Outcomes at 1 month are encouraging!  Audibility confirmed.    Clinic recommendations:  Next Visit: outcomes      Recommendations:   ? Loretta Maxwell will return as scheduled for routine mapping, in 1 year, or sooner if she obtains a new processor.

## 2022-08-07 ENCOUNTER — Encounter: Admit: 2022-08-07 | Discharge: 2022-08-07 | Payer: Medicare Other

## 2022-08-07 MED ORDER — CLONAZEPAM 0.5 MG PO TBDI
ORAL_TABLET | 0 refills
Start: 2022-08-07 — End: ?

## 2022-08-27 ENCOUNTER — Encounter: Admit: 2022-08-27 | Discharge: 2022-08-27 | Payer: Medicare Other

## 2022-08-27 MED ORDER — CLONAZEPAM 0.25 MG PO TBDI
.25 mg | ORAL_TABLET | Freq: Three times a day (TID) | ORAL | 0 refills | Status: AC | PRN
Start: 2022-08-27 — End: ?

## 2022-08-28 ENCOUNTER — Encounter: Admit: 2022-08-28 | Discharge: 2022-08-28 | Payer: Medicare Other

## 2022-08-28 DIAGNOSIS — K589 Irritable bowel syndrome without diarrhea: Secondary | ICD-10-CM

## 2022-08-28 DIAGNOSIS — M1990 Unspecified osteoarthritis, unspecified site: Secondary | ICD-10-CM

## 2022-08-28 DIAGNOSIS — K219 Gastro-esophageal reflux disease without esophagitis: Secondary | ICD-10-CM

## 2022-08-28 DIAGNOSIS — G4733 Obstructive sleep apnea (adult) (pediatric): Secondary | ICD-10-CM

## 2022-08-28 DIAGNOSIS — H8109 Meniere's disease, unspecified ear: Secondary | ICD-10-CM

## 2022-08-28 DIAGNOSIS — R000 Tachycardia, unspecified: Secondary | ICD-10-CM

## 2022-08-28 DIAGNOSIS — I82409 Acute embolism and thrombosis of unspecified deep veins of unspecified lower extremity: Secondary | ICD-10-CM

## 2022-08-28 DIAGNOSIS — E119 Type 2 diabetes mellitus without complications: Secondary | ICD-10-CM

## 2022-08-28 DIAGNOSIS — K760 Fatty (change of) liver, not elsewhere classified: Secondary | ICD-10-CM

## 2022-08-28 DIAGNOSIS — G510 Bell's palsy: Secondary | ICD-10-CM

## 2022-08-28 MED ORDER — CLONAZEPAM 0.25 MG PO TBDI
.25 mg | ORAL_TABLET | Freq: Three times a day (TID) | ORAL | 0 refills | Status: AC | PRN
Start: 2022-08-28 — End: ?

## 2022-09-22 ENCOUNTER — Encounter: Admit: 2022-09-22 | Discharge: 2022-09-22 | Payer: Medicare Other

## 2022-09-22 MED ORDER — CLONAZEPAM 0.25 MG PO TBDI
ORAL_TABLET | 0 refills
Start: 2022-09-22 — End: ?

## 2022-09-27 ENCOUNTER — Encounter: Admit: 2022-09-27 | Discharge: 2022-09-27 | Payer: Medicare Other

## 2022-10-03 ENCOUNTER — Encounter: Admit: 2022-10-03 | Discharge: 2022-10-03 | Payer: Medicare Other

## 2022-10-03 ENCOUNTER — Ambulatory Visit: Admit: 2022-10-03 | Discharge: 2022-10-04 | Payer: Medicare Other

## 2022-10-03 DIAGNOSIS — H8109 Meniere's disease, unspecified ear: Secondary | ICD-10-CM

## 2022-10-03 DIAGNOSIS — R000 Tachycardia, unspecified: Secondary | ICD-10-CM

## 2022-10-03 DIAGNOSIS — E119 Type 2 diabetes mellitus without complications: Secondary | ICD-10-CM

## 2022-10-03 DIAGNOSIS — K219 Gastro-esophageal reflux disease without esophagitis: Secondary | ICD-10-CM

## 2022-10-03 DIAGNOSIS — G510 Bell's palsy: Secondary | ICD-10-CM

## 2022-10-03 DIAGNOSIS — I82409 Acute embolism and thrombosis of unspecified deep veins of unspecified lower extremity: Secondary | ICD-10-CM

## 2022-10-03 DIAGNOSIS — K760 Fatty (change of) liver, not elsewhere classified: Secondary | ICD-10-CM

## 2022-10-03 DIAGNOSIS — M1990 Unspecified osteoarthritis, unspecified site: Secondary | ICD-10-CM

## 2022-10-03 DIAGNOSIS — G4733 Obstructive sleep apnea (adult) (pediatric): Secondary | ICD-10-CM

## 2022-10-03 DIAGNOSIS — K589 Irritable bowel syndrome without diarrhea: Secondary | ICD-10-CM

## 2022-12-07 ENCOUNTER — Encounter: Admit: 2022-12-07 | Discharge: 2022-12-07 | Payer: Medicare Other

## 2022-12-15 ENCOUNTER — Encounter: Admit: 2022-12-15 | Discharge: 2022-12-15 | Payer: Medicare Other

## 2022-12-15 MED ORDER — CLONAZEPAM 0.25 MG PO TBDI
ORAL_TABLET | 0 refills
Start: 2022-12-15 — End: ?

## 2023-03-27 ENCOUNTER — Encounter: Admit: 2023-03-27 | Discharge: 2023-03-27 | Payer: Medicare Other

## 2023-07-22 ENCOUNTER — Encounter: Admit: 2023-07-22 | Discharge: 2023-07-22 | Payer: Medicare Other

## 2023-11-28 ENCOUNTER — Encounter: Admit: 2023-11-28 | Discharge: 2023-11-28 | Payer: Medicare Other

## 2024-08-06 ENCOUNTER — Encounter: Admit: 2024-08-06 | Discharge: 2024-08-06 | Payer: MEDICARE
